# Patient Record
Sex: Female | Born: 1995
Health system: Southern US, Community
[De-identification: ages and names within clinical notes are randomized; demographics above are authoritative.]

## PROBLEM LIST (undated history)

## (undated) DIAGNOSIS — K219 Gastro-esophageal reflux disease without esophagitis: Secondary | ICD-10-CM

## (undated) DIAGNOSIS — T148XXA Other injury of unspecified body region, initial encounter: Secondary | ICD-10-CM

## (undated) DIAGNOSIS — R079 Chest pain, unspecified: Secondary | ICD-10-CM

## (undated) DIAGNOSIS — L309 Dermatitis, unspecified: Secondary | ICD-10-CM

## (undated) HISTORY — PX: TONSILLECTOMY: SUR1361

---
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
## 2018-08-28 IMAGING — CT CT CHEST W/ CM
2 of 4 series · 14 of 36 positions shown, 17 images · IV contrast (APPLIED)
Comparison: None.

CLINICAL DATA: Idiopathic thrombocytopenic [place]. Evaluate for
lymphoma.

EXAM:
CT CHEST, ABDOMEN, AND PELVIS WITH CONTRAST
TECHNIQUE: Multidetector CT imaging of the chest, abdomen and pelvis was
performed following the standard protocol during bolus
administration of intravenous contrast.
CONTRAST:  100mL 6HONK7-GSS IOPAMIDOL (6HONK7-GSS) INJECTION 61%

[Series 2: cap with · axial · 0.98mm/px · z∈[-607,-107]mm · 11 of 121 slices shown, 14 images]
[im 11/121  mediastinal]
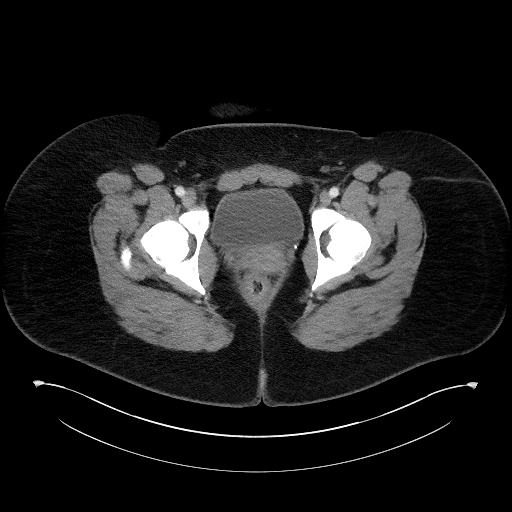
[im 11/121  lung]
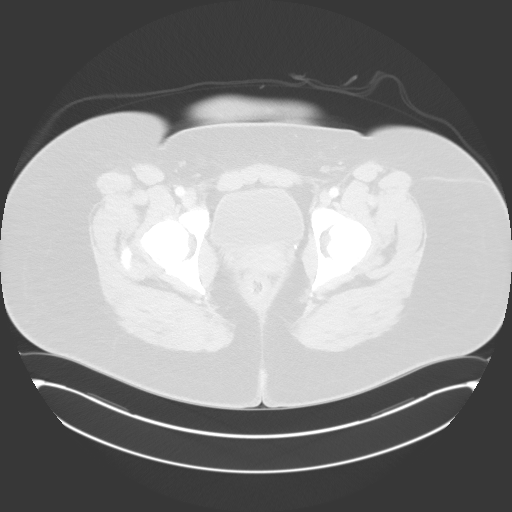
[im 21/121  lung]
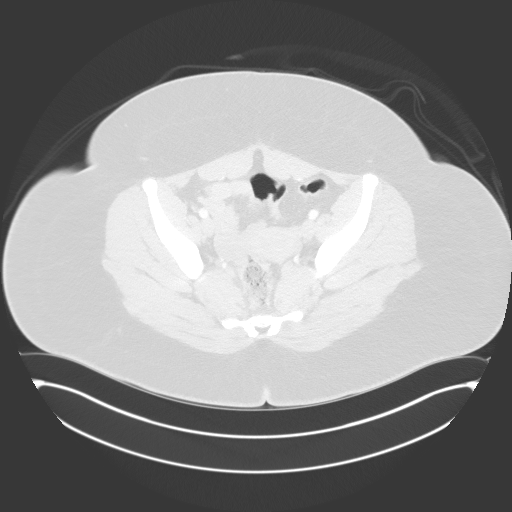
[im 31/121  lung]
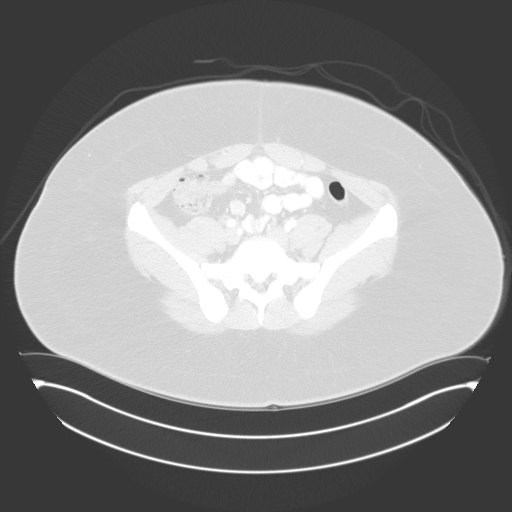
[im 41/121  lung]
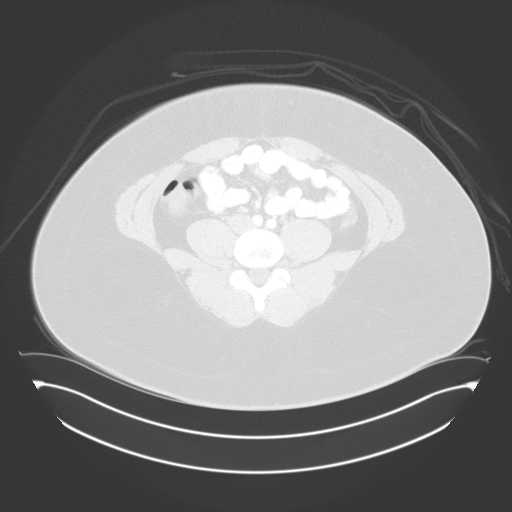
[im 51/121  mediastinal]
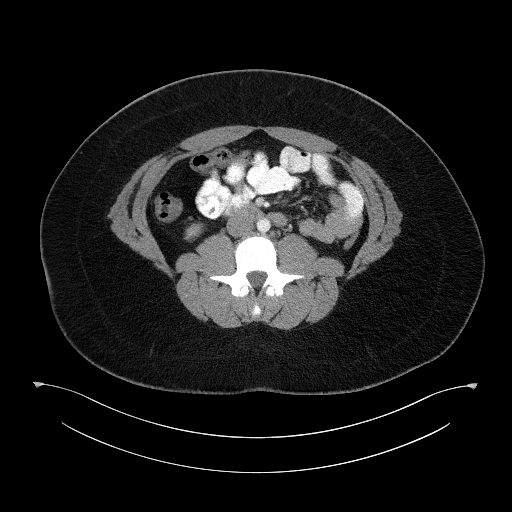
[im 51/121  lung]
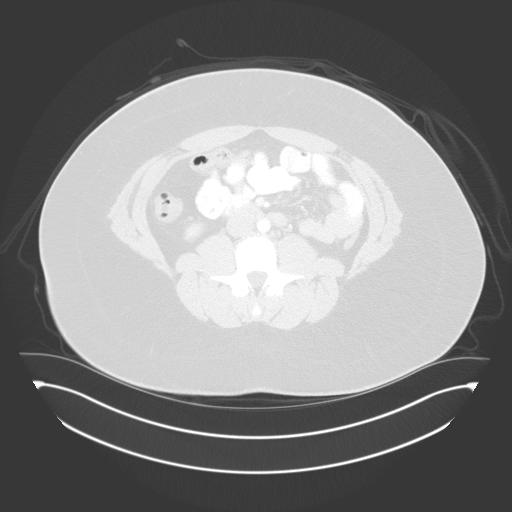
[im 61/121  lung]
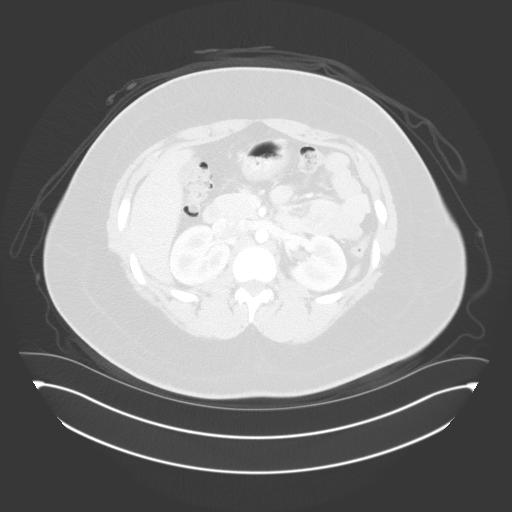
[im 71/121  lung]
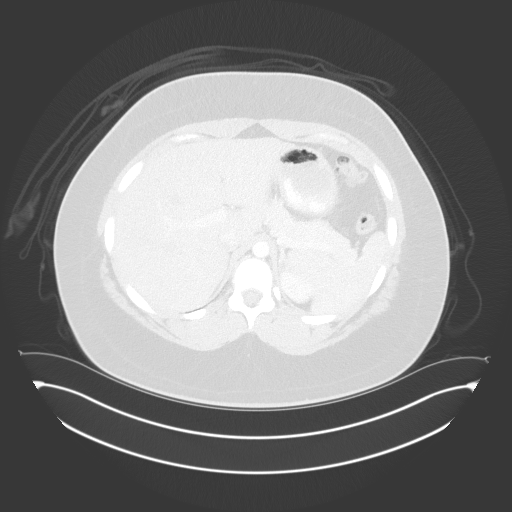
[im 81/121  lung]
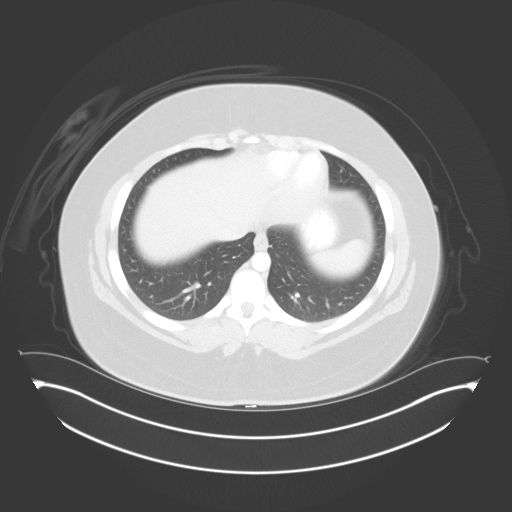
[im 91/121  mediastinal]
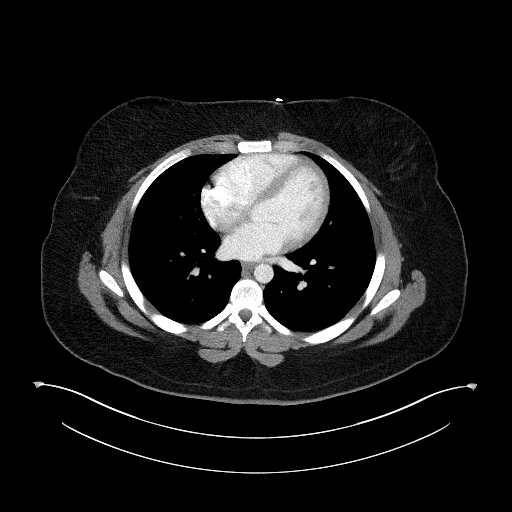
[im 91/121  lung]
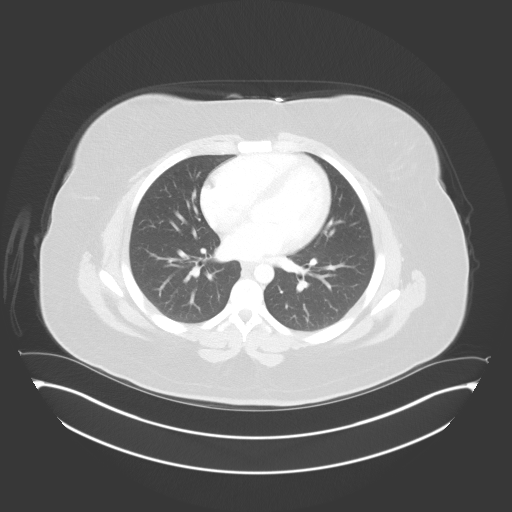
[im 101/121  lung]
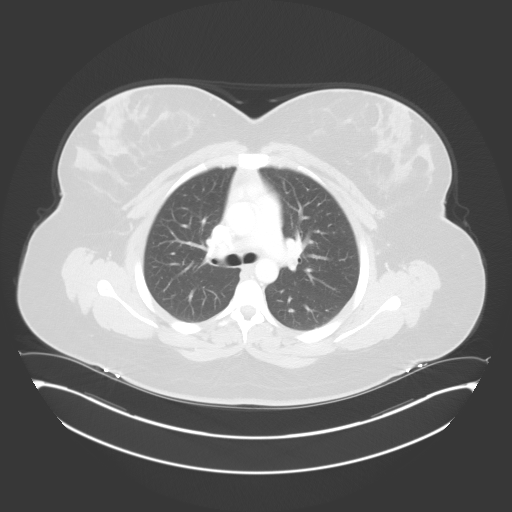
[im 111/121  lung]
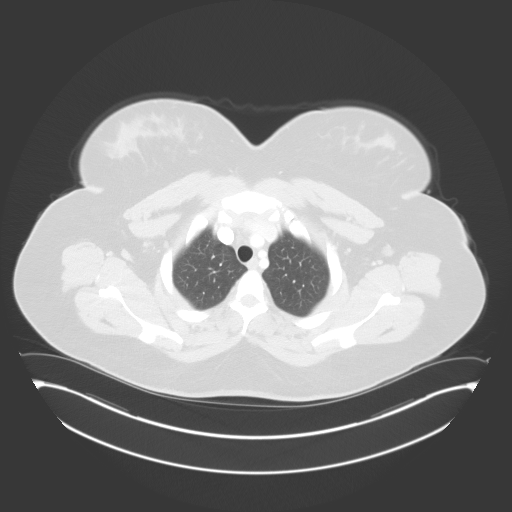

[Series 5: coronals · coronal · 0.82mm/px · 3 of 144 slices shown]
[im 29/144  lung]
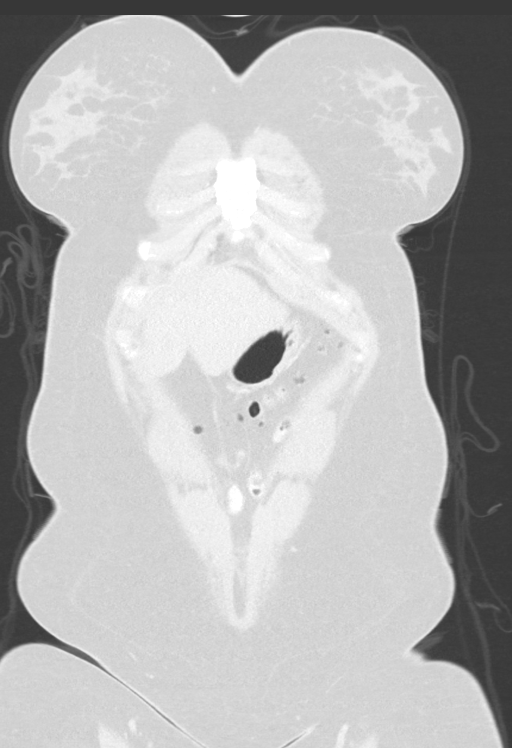
[im 58/144  lung]
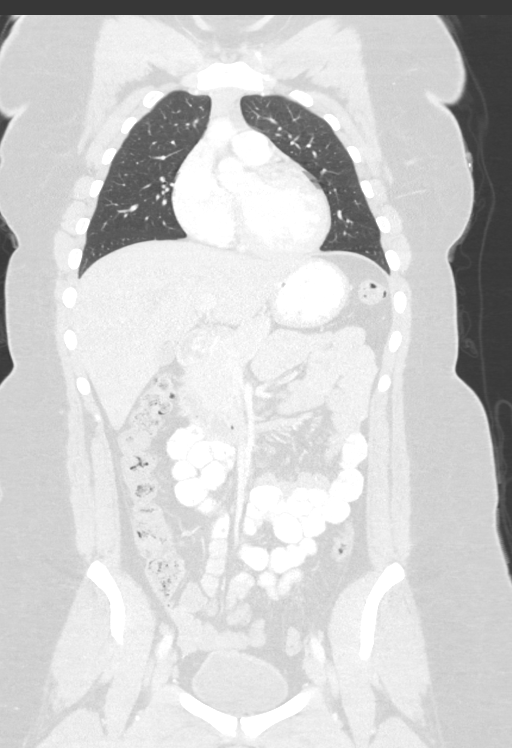
[im 86/144  lung]
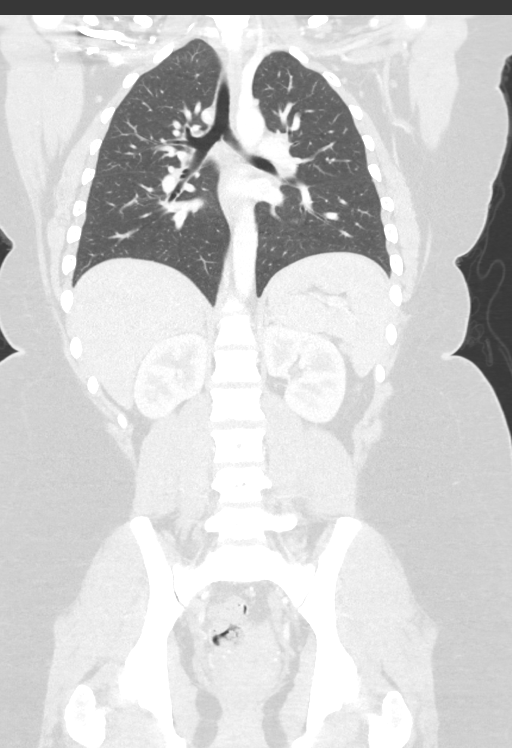

[14 of 36 positions shown; findings below may reference images not displayed]

FINDINGS: CT CHEST FINDINGS

Cardiovascular: No acute findings.

Mediastinum/Lymph Nodes: No masses or pathologically enlarged lymph
nodes identified. Residual thymic tissue noted in anterior
mediastinum.

Lungs/Pleura: No pulmonary infiltrate or mass identified. No
effusion present.

Musculoskeletal:  No suspicious bone lesions identified.

CT ABDOMEN AND PELVIS FINDINGS

Hepatobiliary: No masses identified. Gallbladder is unremarkable.

Pancreas:  No mass or inflammatory changes.

Spleen:  Within normal limits in size and appearance.

Adrenals/Urinary tract:  No masses or hydronephrosis.

Stomach/Bowel: No evidence of obstruction, inflammatory process, or
abnormal fluid collections.

Vascular/Lymphatic: No pathologically enlarged lymph nodes
identified. No abdominal aortic aneurysm.

Reproductive: Small right ovarian follicular cysts noted. No mass or
other significant abnormality identified.

Other:  None.

Musculoskeletal:  No suspicious bone lesions identified.
IMPRESSION: No evidence of lymphadenopathy, splenomegaly, or other significant
abnormality.

## 2019-01-24 ENCOUNTER — Encounter (HOSPITAL_COMMUNITY): Payer: Self-pay | Admitting: Emergency Medicine

## 2019-01-24 ENCOUNTER — Other Ambulatory Visit: Payer: Self-pay

## 2019-01-24 ENCOUNTER — Emergency Department (HOSPITAL_COMMUNITY)
Admission: EM | Admit: 2019-01-24 | Discharge: 2019-01-25 | Discharge status: Against Medical Advice | Attending: Emergency Medicine | Admitting: Emergency Medicine

## 2019-01-24 DIAGNOSIS — Z5321 Procedure and treatment not carried out due to patient leaving prior to being seen by health care provider: Secondary | ICD-10-CM | POA: Insufficient documentation

## 2019-01-24 DIAGNOSIS — R1010 Upper abdominal pain, unspecified: Secondary | ICD-10-CM | POA: Diagnosis present

## 2019-01-24 LAB — COMPREHENSIVE METABOLIC PANEL
ALT: 13 U/L (ref 0–44)
AST: 19 U/L (ref 15–41)
Albumin: 4.3 g/dL (ref 3.5–5.0)
Alkaline Phosphatase: 67 U/L (ref 38–126)
Anion gap: 8 (ref 5–15)
BUN: 8 mg/dL (ref 6–20)
CO2: 27 mmol/L (ref 22–32)
Calcium: 8.9 mg/dL (ref 8.9–10.3)
Chloride: 107 mmol/L (ref 98–111)
Creatinine, Ser: 0.87 mg/dL (ref 0.44–1.00)
GFR calc Af Amer: >60 mL/min (ref >60)
GFR calc non Af Amer: >60 mL/min (ref >60)
Glucose, Bld: 89 mg/dL (ref 70–99)
Potassium: 4.2 mmol/L (ref 3.5–5.1)
Sodium: 142 mmol/L (ref 135–145)
Total Bilirubin: 0.8 mg/dL (ref 0.3–1.2)
Total Protein: 6.9 g/dL (ref 6.5–8.1)

## 2019-01-24 LAB — CBC
HCT: 39.8 % (ref 36.0–46.0)
Hemoglobin: 12.5 g/dL (ref 12.0–15.0)
MCH: 26.9 pg (ref 26.0–34.0)
MCHC: 31.4 g/dL (ref 30.0–36.0)
MCV: 85.6 fL (ref 80.0–100.0)
Platelets: 204 10*3/uL (ref 150–400)
RBC: 4.65 MIL/uL (ref 3.87–5.11)
RDW: 12.4 % (ref 11.5–15.5)
WBC: 6.1 10*3/uL (ref 4.0–10.5)
nRBC: 0 % (ref 0.0–0.2)

## 2019-01-24 LAB — I-STAT BETA HCG BLOOD, ED (MC, WL, AP ONLY): I-stat hCG, quantitative: <5 m[IU]/mL (ref <5)

## 2019-01-24 LAB — LIPASE, BLOOD: Lipase: 34 U/L (ref 11–51)

## 2019-01-24 MED ORDER — SODIUM CHLORIDE 0.9% FLUSH: 3.0000 mL | Freq: Once | INTRAVENOUS | Status: DC

## 2019-01-24 NOTE — ED Triage Notes (Signed)
Patient here from home with complaints of upper abd pain above the belly button that started 3 weeks ago. Nausea, no vomiting.
